# Patient Record
Sex: Male | Born: 1970 | Race: White | Hispanic: No | Marital: Single | Smoking: Current every day smoker
Health system: Southern US, Community
[De-identification: ages and names within clinical notes are randomized; demographics above are authoritative.]

## PROBLEM LIST (undated history)

## (undated) DIAGNOSIS — J45909 Unspecified asthma, uncomplicated: Secondary | ICD-10-CM

## (undated) DIAGNOSIS — M5430 Sciatica, unspecified side: Secondary | ICD-10-CM

## (undated) DIAGNOSIS — N289 Disorder of kidney and ureter, unspecified: Secondary | ICD-10-CM

## (undated) DIAGNOSIS — M719 Bursopathy, unspecified: Secondary | ICD-10-CM

## (undated) DIAGNOSIS — I10 Essential (primary) hypertension: Secondary | ICD-10-CM

## (undated) HISTORY — PX: APPENDECTOMY: SHX54

## (undated) HISTORY — PX: BACK SURGERY: SHX140

## (undated) HISTORY — PX: NECK SURGERY: SHX720

---

## 2018-07-22 ENCOUNTER — Encounter (HOSPITAL_COMMUNITY): Payer: Self-pay | Admitting: Emergency Medicine

## 2018-07-22 ENCOUNTER — Emergency Department (HOSPITAL_COMMUNITY): Payer: Medicaid - Out of State

## 2018-07-22 ENCOUNTER — Other Ambulatory Visit: Payer: Self-pay

## 2018-07-22 ENCOUNTER — Emergency Department (HOSPITAL_COMMUNITY)
Admission: EM | Admit: 2018-07-22 | Discharge: 2018-07-22 | Disposition: A | Discharge status: Home / Self Care | Payer: Medicaid - Out of State | Attending: Emergency Medicine | Admitting: Emergency Medicine

## 2018-07-22 DIAGNOSIS — I10 Essential (primary) hypertension: Secondary | ICD-10-CM | POA: Insufficient documentation

## 2018-07-22 DIAGNOSIS — F17210 Nicotine dependence, cigarettes, uncomplicated: Secondary | ICD-10-CM | POA: Insufficient documentation

## 2018-07-22 DIAGNOSIS — M5432 Sciatica, left side: Secondary | ICD-10-CM | POA: Insufficient documentation

## 2018-07-22 DIAGNOSIS — M5126 Other intervertebral disc displacement, lumbar region: Secondary | ICD-10-CM | POA: Diagnosis not present

## 2018-07-22 DIAGNOSIS — M545 Low back pain: Secondary | ICD-10-CM | POA: Diagnosis present

## 2018-07-22 DIAGNOSIS — Z79899 Other long term (current) drug therapy: Secondary | ICD-10-CM | POA: Insufficient documentation

## 2018-07-22 DIAGNOSIS — J45909 Unspecified asthma, uncomplicated: Secondary | ICD-10-CM | POA: Insufficient documentation

## 2018-07-22 HISTORY — DX: Bursopathy, unspecified: M71.9

## 2018-07-22 HISTORY — DX: Disorder of kidney and ureter, unspecified: N28.9

## 2018-07-22 HISTORY — DX: Unspecified asthma, uncomplicated: J45.909

## 2018-07-22 HISTORY — DX: Sciatica, unspecified side: M54.30

## 2018-07-22 HISTORY — DX: Essential (primary) hypertension: I10

## 2018-07-22 LAB — BASIC METABOLIC PANEL WITH GFR
Anion gap: 9 (ref 5–15)
BUN: 18 mg/dL (ref 6–20)
CO2: 22 mmol/L (ref 22–32)
Calcium: 9.2 mg/dL (ref 8.9–10.3)
Chloride: 105 mmol/L (ref 98–111)
Creatinine, Ser: 1 mg/dL (ref 0.61–1.24)
GFR calc Af Amer: >60 mL/min
GFR calc non Af Amer: >60 mL/min
Glucose, Bld: 86 mg/dL (ref 70–99)
Potassium: 4.1 mmol/L (ref 3.5–5.1)
Sodium: 136 mmol/L (ref 135–145)

## 2018-07-22 LAB — CBC
HCT: 39.7 % (ref 39.0–52.0)
Hemoglobin: 13.3 g/dL (ref 13.0–17.0)
MCH: 31.9 pg (ref 26.0–34.0)
MCHC: 33.5 g/dL (ref 30.0–36.0)
MCV: 95.2 fL (ref 80.0–100.0)
Platelets: 239 K/uL (ref 150–400)
RBC: 4.17 MIL/uL — ABNORMAL LOW (ref 4.22–5.81)
RDW: 12.8 % (ref 11.5–15.5)
WBC: 11.5 K/uL — ABNORMAL HIGH (ref 4.0–10.5)
nRBC: 0 % (ref 0.0–0.2)

## 2018-07-22 MED ORDER — AMLODIPINE BESYLATE 10 MG PO TABS: 10.0000 mg | ORAL_TABLET | Freq: Every day | ORAL | 1 refills | Status: AC

## 2018-07-22 MED ORDER — AMLODIPINE BESYLATE 5 MG PO TABS
10.0000 mg | ORAL_TABLET | Freq: Once | ORAL | Status: AC
  Administered 2018-07-22: 10 mg via ORAL
  Filled 2018-07-22: qty 2

## 2018-07-22 MED ORDER — PREDNISONE 50 MG PO TABS: 50.0000 mg | ORAL_TABLET | Freq: Every day | ORAL | 0 refills | Status: AC

## 2018-07-22 MED ORDER — HYDROCODONE-ACETAMINOPHEN 5-325 MG PO TABS: 1.0000 | ORAL_TABLET | Freq: Four times a day (QID) | ORAL | 0 refills | Status: AC | PRN

## 2018-07-22 MED ORDER — DEXAMETHASONE SODIUM PHOSPHATE 4 MG/ML IJ SOLN
10.0000 mg | Freq: Once | INTRAMUSCULAR | Status: AC
  Administered 2018-07-22: 10 mg via INTRAVENOUS
  Filled 2018-07-22: qty 3

## 2018-07-22 MED ORDER — HYDROMORPHONE HCL 1 MG/ML IJ SOLN
1.0000 mg | Freq: Once | INTRAMUSCULAR | Status: AC
  Administered 2018-07-22: 1 mg via INTRAVENOUS
  Filled 2018-07-22: qty 1

## 2018-07-22 MED ORDER — NAPROXEN 500 MG PO TABS: 500.0000 mg | ORAL_TABLET | Freq: Two times a day (BID) | ORAL | 0 refills | Status: AC

## 2018-07-22 NOTE — ED Triage Notes (Signed)
Pt c/o LT lower back pain that radiates down LT leg. Pt unable to sit still due to pain. Pt hx of sciatica, states pain feels the same. This has been a chronic problem, but pain became unbearable last night per patient.

## 2018-07-22 NOTE — ED Provider Notes (Signed)
[facility] EMERGENCY DEPARTMENT Provider Note   CSN: [id number] Arrival date & time: 07/22/18  9798    History   Chief Complaint Chief Complaint  Patient presents with  . Back Pain    HPI Edward Becker is a 48 y.o. male.     HPI Pt started having pain last night.  He has a history of sciatica and has had trouble before but this episode is severe.  Like when he had his surgery before. Pt is having pain in his lower back, it radiates down the left side of his leg.  The pain is severe and he cannot find a comofortable spot. No numbness or weakness.  No incontinence.     Patient also has a history of hypertension.  He has not been taking any of his blood pressure medications.  He denies any trouble with chest pain or shortness of breath.  No abdominal pain Past Medical History:  Diagnosis Date  . Asthma   . Bursitis    RT hand  . Hypertension   . Renal disorder    renal failure  . Sciatica     There are no active problems to display for this patient.   Past Surgical History:  Procedure Laterality Date  . APPENDECTOMY    . BACK SURGERY    . NECK SURGERY          Home Medications    Prior to Admission medications   Medication Sig Start Date End Date Taking? Authorizing Provider  chlorthalidone (HYGROTON) 50 MG tablet Take 50 mg by mouth daily.  04/07/18  Yes [provider]  ibuprofen (ADVIL,MOTRIN) 200 MG tablet Take 800 mg by mouth every 6 (six) hours as needed for mild pain or moderate pain.   Yes [provider]  traMADol (ULTRAM) 50 MG tablet Take 50-100 mg by mouth every 6 (six) hours as needed for moderate pain or severe pain.   Yes [provider]  amLODipine (NORVASC) 10 MG tablet Take 1 tablet (10 mg total) by mouth daily. 07/22/18   Linwood Dibbles, MD  HYDROcodone-acetaminophen (NORCO/VICODIN) 5-325 MG tablet Take 1 tablet by mouth every 6 (six) hours as needed. 07/22/18   Linwood Dibbles, MD  naproxen (NAPROSYN) 500 MG tablet Take 1 tablet  (500 mg total) by mouth 2 (two) times daily. 07/22/18   Linwood Dibbles, MD  predniSONE (DELTASONE) 50 MG tablet Take 1 tablet (50 mg total) by mouth daily. 07/22/18   Linwood Dibbles, MD    Family History No family history on file.  Social History Social History   Tobacco Use  . Smoking status: Current Every Day Smoker    Packs/day: 0.50    Types: Cigarettes  . Smokeless tobacco: Never Used  Substance Use Topics  . Alcohol use: Yes    Comment: rarely  . Drug use: Yes    Types: Marijuana    Comment: last use last week     Allergies   Patient has no known allergies.   Review of Systems Review of Systems  All other systems reviewed and are negative.    Physical Exam Updated Vital Signs BP (!) 141/97   Pulse 71   Temp 97.9 F (36.6 C)   Resp 13   Ht 1.702 m (5\' 7" )   Wt 59 kg   SpO2 98%   BMI 20.36 kg/m   Physical Exam Vitals signs and nursing note reviewed.  Constitutional:      General: He is in acute distress.  Appearance: He is well-developed.     Comments: Rocking back and forth in the gurney  HENT:     Head: Normocephalic and atraumatic.     Right Ear: External ear normal.     Left Ear: External ear normal.  Eyes:     General: No scleral icterus.       Right eye: No discharge.        Left eye: No discharge.     Conjunctiva/sclera: Conjunctivae normal.  Neck:     Musculoskeletal: Neck supple.     Trachea: No tracheal deviation.  Cardiovascular:     Rate and Rhythm: Normal rate and regular rhythm.  Pulmonary:     Effort: Pulmonary effort is normal. No respiratory distress.     Breath sounds: Normal breath sounds. No stridor. No wheezing or rales.  Abdominal:     General: Bowel sounds are normal. There is no distension.     Palpations: Abdomen is soft.     Tenderness: There is no abdominal tenderness. There is no guarding or rebound.  Musculoskeletal:        General: Tenderness present.     Comments: Extremities are warm and well perfused, tenderness  palpation in the left paraspinal region as well as left buttock  Skin:    General: Skin is warm and dry.     Findings: No rash.  Neurological:     Mental Status: He is alert.     Cranial Nerves: No cranial nerve deficit (no facial droop, extraocular movements intact, no slurred speech).     Sensory: No sensory deficit.     Motor: No abnormal muscle tone or seizure activity.     Coordination: Coordination normal.     Comments: 5 out of 5 strength bilateral lower extremities, normal plantar flexion and dorsiflexion, normal sensation bilaterally,      ED Treatments / Results  Labs (all labs ordered are listed, but only abnormal results are displayed) Labs Reviewed  CBC - Abnormal; Notable for the following components:      Result Value   WBC 11.5 (*)    RBC 4.17 (*)    All other components within normal limits  BASIC METABOLIC PANEL    EKG EKG Interpretation  Date/Time:  Thursday July 22 2018 12:57:45 EST Ventricular Rate:  82 PR Interval:    QRS Duration: 92 QT Interval:  375 QTC Calculation: 438 R Axis:   68 Text Interpretation:  Sinus rhythm Biatrial enlargement RSR' in V1 or V2, probably normal variant No old tracing to compare Confirmed by Linwood Dibbles [phone]) on 07/22/2018 1:09:05 PM   Radiology Dg Lumbar Spine Complete  Result Date: 07/22/2018 CLINICAL DATA:  Left lower back pain radiating to the left leg. EXAM: LUMBAR SPINE - COMPLETE 4+ VIEW COMPARISON:  None. FINDINGS: There is no evidence of lumbar spine fracture. Alignment is normal. 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 Electronically Signed   By: Ted Mcalpine M.D.   On: 07/22/2018 13:28   Mr Lumbar Spine Wo Contrast  Result Date: 07/22/2018 CLINICAL DATA:  48 y/o M; chronic severe left leg and back pain with worsening last night. EXAM: MRI LUMBAR SPINE WITHOUT CONTRAST TECHNIQUE: Multiplanar,  multisequence MR imaging of the lumbar spine was performed. No intravenous contrast was administered. COMPARISON:  07/22/2018 lumbar spine radiographs. FINDINGS: Segmentation:  Standard. Alignment:  Physiologic. Vertebrae: No fracture, evidence of discitis, or bone lesion. There is mild edema  the anterior endplate corners best appreciated at L3-L5 levels. Conus medullaris and cauda equina: Conus extends to the L1 level. Conus and cauda equina appear normal. Paraspinal and other soft tissues: Negative. Disc levels: L1-2: No significant disc displacement, foraminal stenosis, or canal stenosis. L2-3: No significant disc displacement, foraminal stenosis, or canal stenosis. L3-4: Disc bulge with mild bilateral foraminal and lateral recess stenosis. No significant spinal canal stenosis. L4-5: Disc bulge with left central and subarticular disc extrusion migrating inferiorly to the L5 infra pedicular level measuring 10 x 16 mm (AP by ML series 6, image 27). The extrusion effaces the left lateral recess where it impinges the descending left L5 nerve root. Mild bilateral neural foraminal stenosis and spinal canal stenosis. L5-S1: Disc bulge with endplate marginal osteophytes and facet hypertrophy resulting in moderate bilateral neural foraminal stenosis. No spinal canal stenosis. IMPRESSION: 1. L4-5 left central and subarticular disc extrusion with inferior migration. The extrusion effaces left lateral recess and impinges the descending left L5 nerve root. 2. Lumbar spondylosis at L3-4 and L5-S1 levels with mild bilateral L3-4 and moderate bilateral L5-S1 foraminal stenosis. 3. Mild edema at the anterior endplate corners best appreciated at L3-L5 can be seen with early spondyloarthropathy, clinical correlation recommended. Electronically Signed   By: Mitzi HansenLance  Furusawa-Stratton M.D.   On: 07/22/2018 15:07    Procedures Procedures (including critical care time)  Medications Ordered in ED Medications  HYDROmorphone  (DILAUDID) injection 1 mg (1 mg Intravenous Given 07/22/18 1250)  amLODipine (NORVASC) tablet 10 mg (10 mg Oral Given 07/22/18 1251)  HYDROmorphone (DILAUDID) injection 1 mg (1 mg Intravenous Given 07/22/18 1412)  dexamethasone (DECADRON) injection 10 mg (10 mg Intravenous Given 07/22/18 1410)     Initial Impression / Assessment and Plan / ED Course  I have reviewed the triage vital signs and the nursing notes.  Pertinent labs & imaging results that were available during my care of the patient were reviewed by me and considered in my medical decision making (see chart for details).  Clinical Course as of Jul 21 1545  Thu Jul 22, 2018  1405 Pain improving.     [JK]  1428 Pt still complaining of severe cramping leg pain.   Will proceed with MRI.  BP improving with meds and pain management   [JK]  1541 Pt is feeling much better.  Pain has improved.    [JK]    Clinical Course User Index [JK] Linwood DibblesKnapp, Shamela Haydon, MD     Patient presented to the emergency room for severe of lumbar radiculopathy type pain.  Patient continued to have pain despite his initial treatment so MRI was performed.  It is indeed confirm L4-L5 disc herniation with nerve impingement.  Patient symptoms ultimately improved with treatment in the ED.  He has no acute weakness on exam.  Plan on discharge home with prescriptions for steroids and pain medications.  Patient was also noted to be significantly hypertension.  He was treated with an oral hypertensive.  His blood pressure improved with treatment as well as his pain management.  I discussed importance of follow-up with a primary care doctor.  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

## 2018-07-22 NOTE — Discharge Instructions (Addendum)
Take the medications as prescribed, follow-up with a neurosurgeon for further evaluation.  Follow up with a primary care doctor to follow up on your blood pressure.

## 2019-09-25 IMAGING — DX DG LUMBAR SPINE COMPLETE 4+V
5 series · 5 of 5 positions shown · non-contrast
Comparison: None.

CLINICAL DATA: Left lower back pain radiating to the left leg.

EXAM:
LUMBAR SPINE - COMPLETE 4+ VIEW

[l-spine ap]
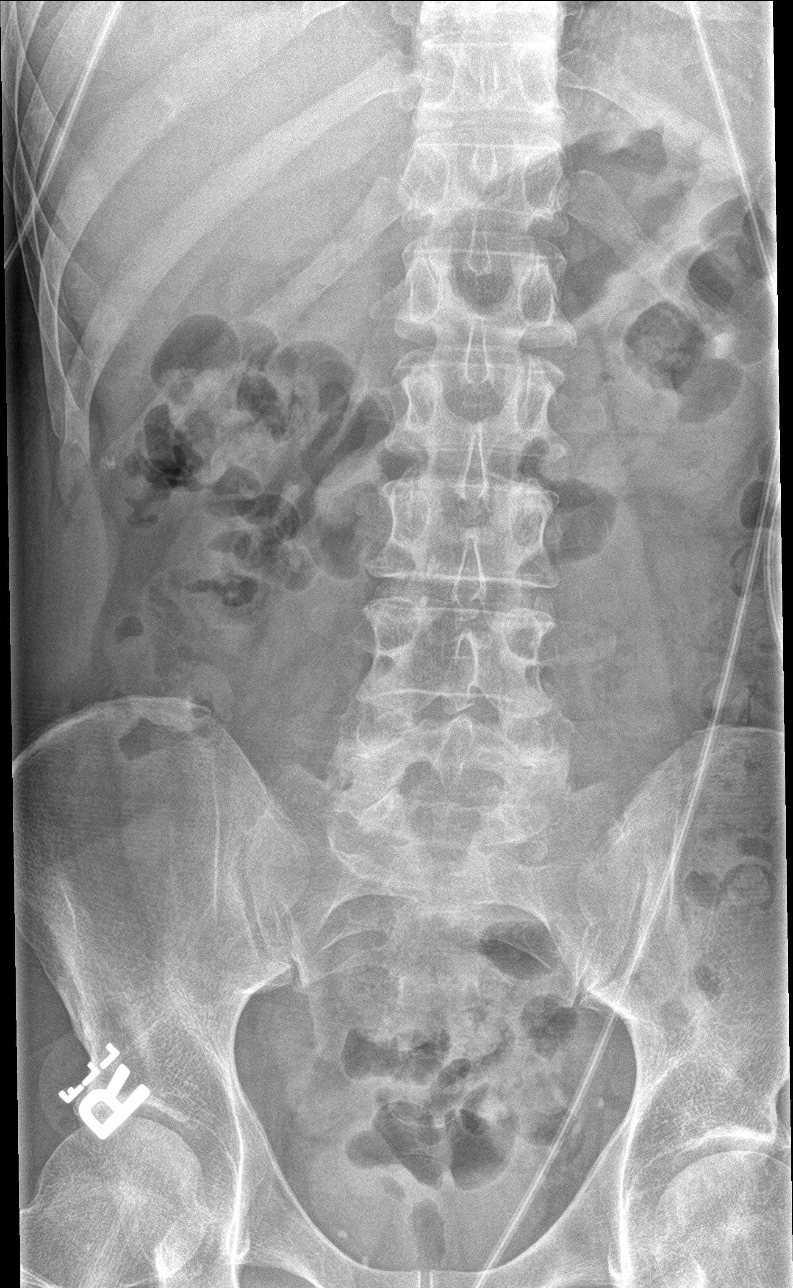

[l-spine obl (1 of 2)]
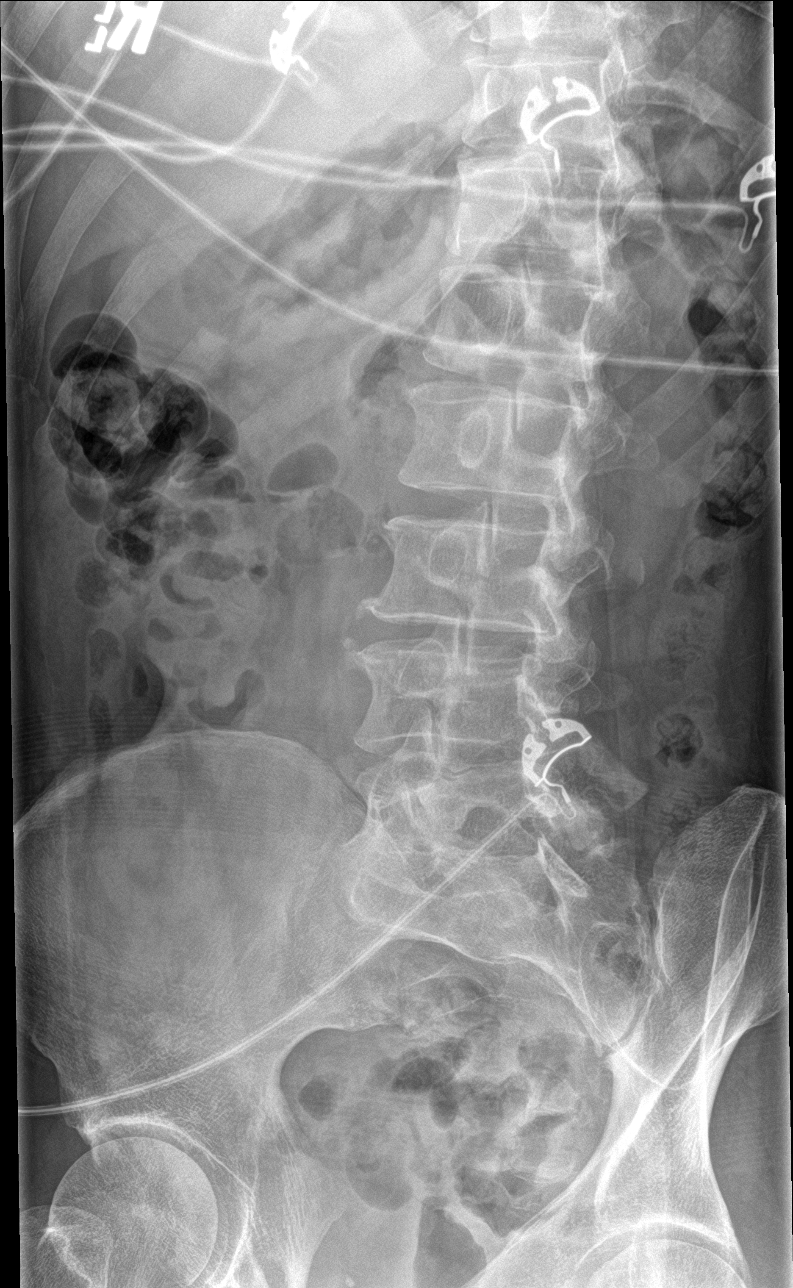

[l-spine obl (2 of 2)]
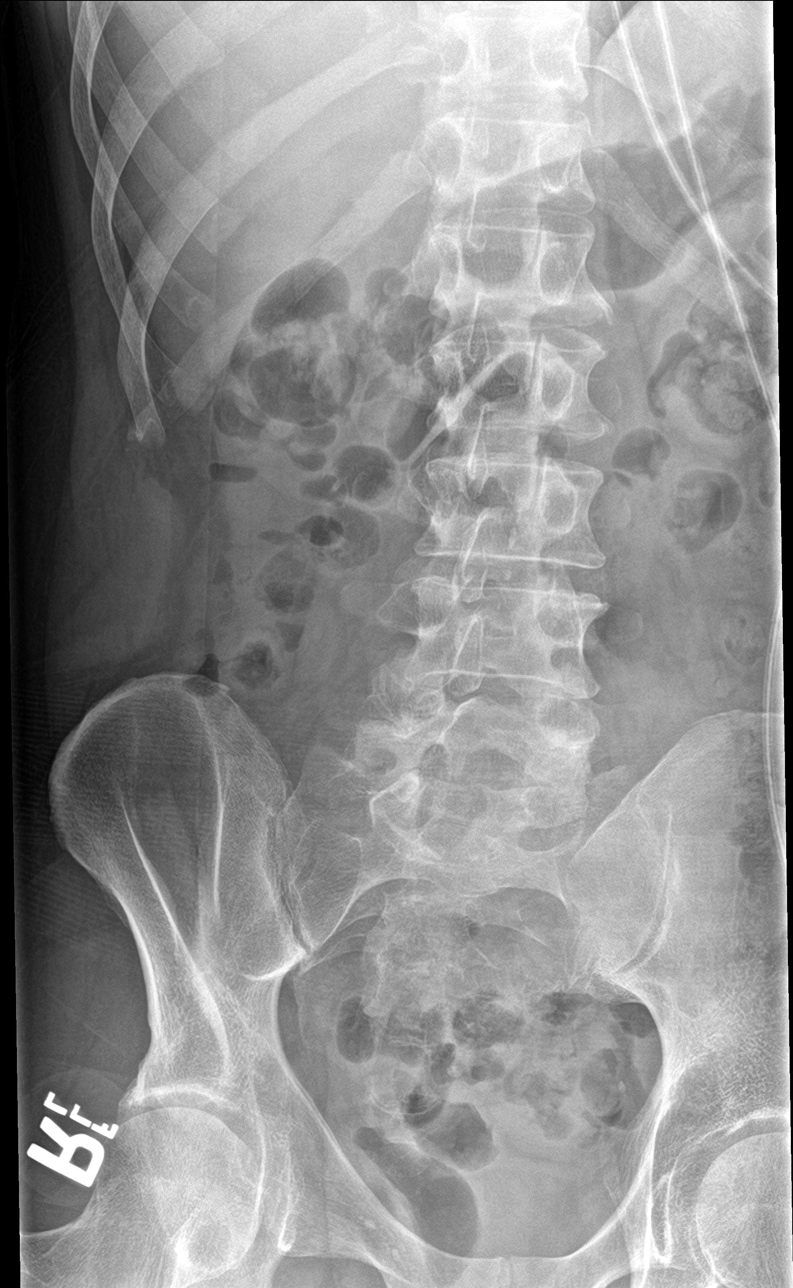

[l-spine lat]
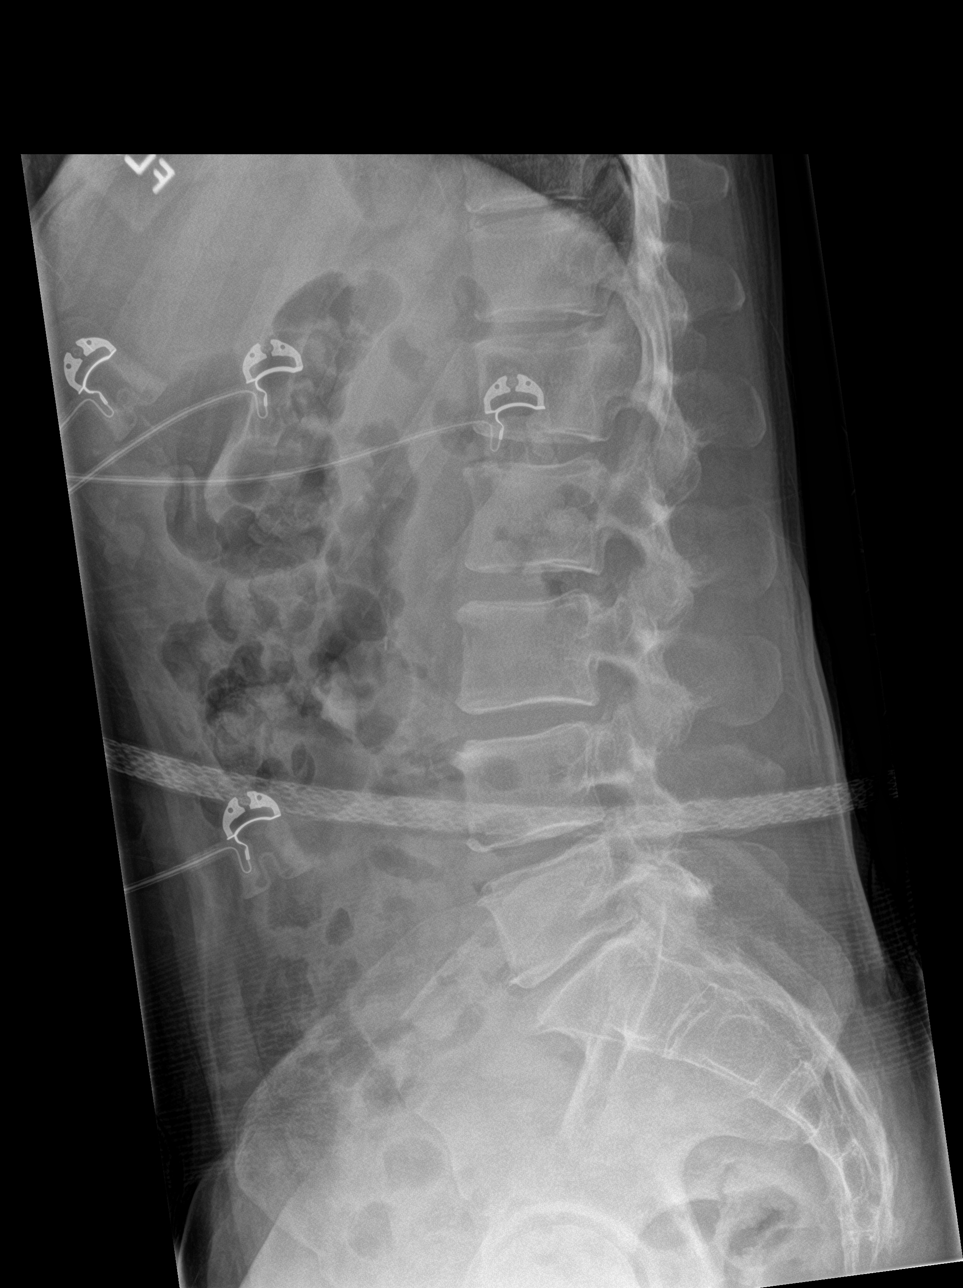

[l-spine spot]
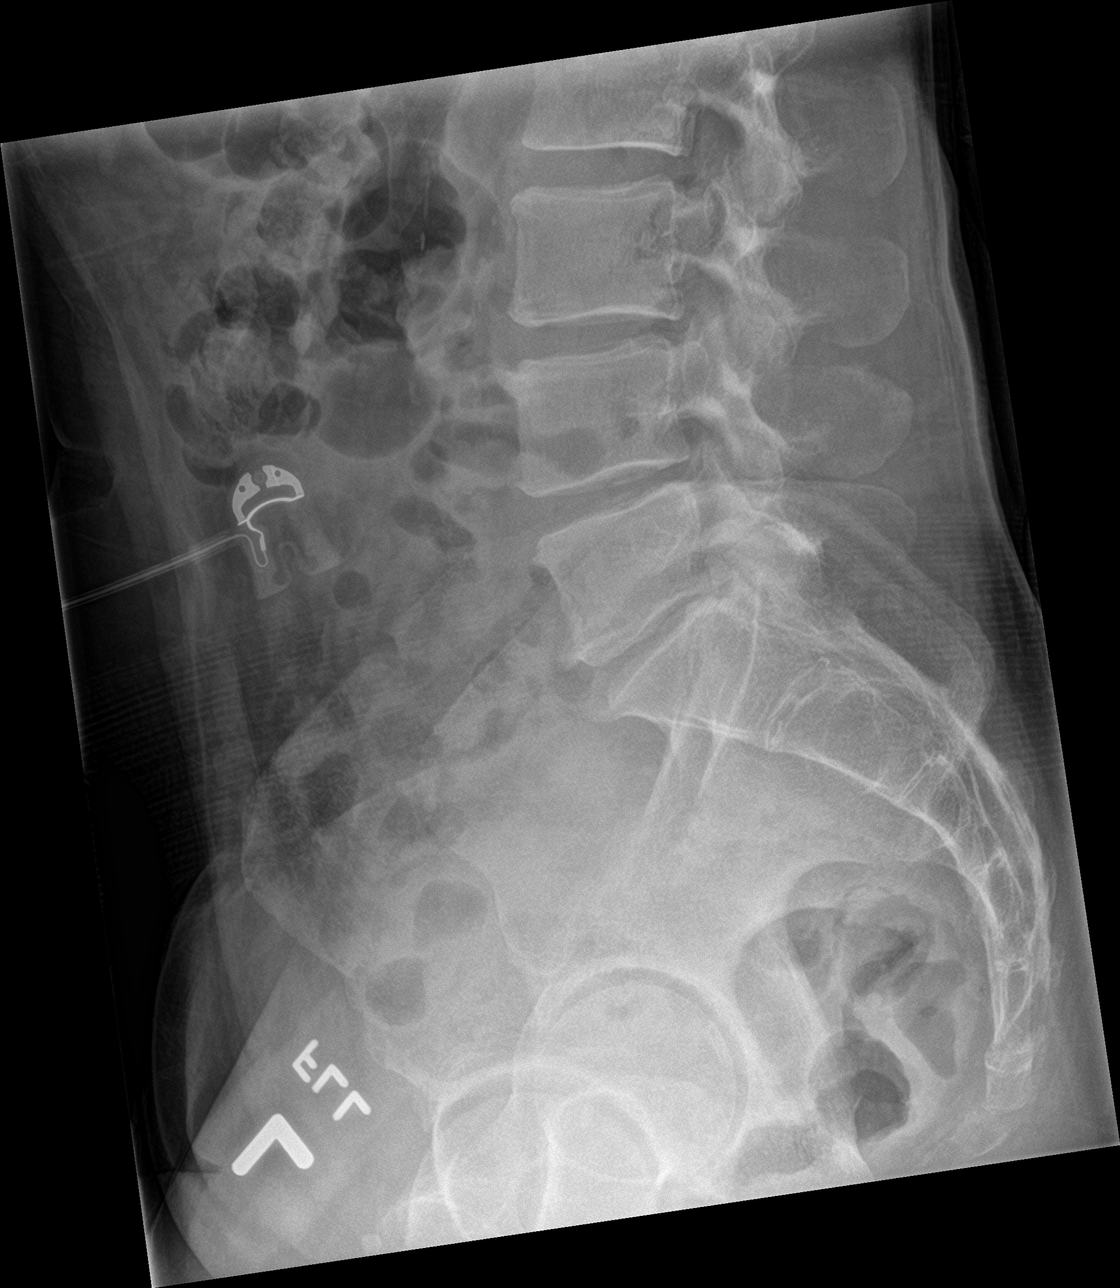

[5 of 5 positions shown; findings below may reference images not displayed]

FINDINGS: There is no evidence of lumbar spine fracture. Alignment is normal.
Mild osteoarthritic changes at L5-S1 with disc space narrowing and
posterior facet arthropathy.
IMPRESSION: Mild osteoarthritic changes at L5-S1.
